# Patient Record
Sex: Female | Born: 1955 | Smoking: Former smoker
Health system: Southern US, Community
[De-identification: ages and names within clinical notes are randomized; demographics above are authoritative.]

## PROBLEM LIST (undated history)

## (undated) DIAGNOSIS — M549 Dorsalgia, unspecified: Secondary | ICD-10-CM

## (undated) DIAGNOSIS — M1990 Unspecified osteoarthritis, unspecified site: Secondary | ICD-10-CM

## (undated) DIAGNOSIS — M797 Fibromyalgia: Secondary | ICD-10-CM

## (undated) DIAGNOSIS — I10 Essential (primary) hypertension: Secondary | ICD-10-CM

## (undated) HISTORY — DX: Fibromyalgia: M79.7

## (undated) HISTORY — DX: Essential (primary) hypertension: I10

## (undated) HISTORY — PX: OTHER SURGICAL HISTORY: SHX169

## (undated) HISTORY — DX: Unspecified osteoarthritis, unspecified site: M19.90

---

## 2016-05-31 ENCOUNTER — Ambulatory Visit: Payer: Self-pay | Attending: Surgical | Admitting: Physical Therapy

## 2016-05-31 ENCOUNTER — Encounter: Payer: Self-pay | Admitting: Physical Therapy

## 2016-05-31 DIAGNOSIS — M6281 Muscle weakness (generalized): Secondary | ICD-10-CM | POA: Insufficient documentation

## 2016-05-31 DIAGNOSIS — G8929 Other chronic pain: Secondary | ICD-10-CM | POA: Insufficient documentation

## 2016-05-31 DIAGNOSIS — M542 Cervicalgia: Secondary | ICD-10-CM | POA: Insufficient documentation

## 2016-05-31 DIAGNOSIS — M545 Low back pain: Secondary | ICD-10-CM | POA: Insufficient documentation
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
## 2016-06-06 ENCOUNTER — Ambulatory Visit: Payer: Self-pay | Admitting: Physical Therapy

## 2016-06-06 ENCOUNTER — Encounter: Payer: Self-pay | Admitting: Physical Therapy

## 2016-06-06 DIAGNOSIS — M545 Low back pain: Secondary | ICD-10-CM

## 2016-06-06 DIAGNOSIS — M6281 Muscle weakness (generalized): Secondary | ICD-10-CM

## 2016-06-06 DIAGNOSIS — G8929 Other chronic pain: Secondary | ICD-10-CM

## 2016-06-06 DIAGNOSIS — I10 Essential (primary) hypertension: Secondary | ICD-10-CM | POA: Insufficient documentation

## 2016-06-06 DIAGNOSIS — M542 Cervicalgia: Secondary | ICD-10-CM

## 2016-06-06 NOTE — Therapy (Signed)
[facility] [address] [place], [place], [address] Phone: [phone]   Fax:  [phone]  Physical Therapy Treatment  Patient Details  Name: Kristine Scott MRN: [id number] Date of Birth: [birth year] Referring Provider: Doy MinceAnsari, Farrukh MD   Encounter Date: 06/06/2016      PT End of Session - 06/06/16 1325    Visit Number 2   Number of Visits 16   Date for PT Re-Evaluation 07/26/16   Authorization Type auto insurance    PT Start Time 0915   PT Stop Time 1009   PT Time Calculation (min) 54 min   Activity Tolerance Patient tolerated treatment well   Behavior During Therapy [facility] for tasks assessed/performed      Past Medical History:  Diagnosis Date  . Hypertension     Past Surgical History:  Procedure Laterality Date  . kidney      Stone removal    There were no vitals filed for this visit.      Subjective Assessment - 06/06/16 1318    Subjective Patient reports her left kene is very sore today. She can not rememebr doing anything to her knee. She still reports pain in her neck.    Patient is accompained by: Family member   Pertinent History Past history of neck and back pain    How long can you sit comfortably? sitting for a proloinged period of time    How long can you stand comfortably? < 10 minutes    How long can you walk comfortably? limited community distances with pain    Diagnostic tests MRI: (done in [place]) will have to fax    Patient Stated Goals To have less pain with daily activity    Currently in Pain? Yes   Pain Score 6    Pain Location Neck   Pain Orientation Right;Left   Pain Descriptors / Indicators Aching   Pain Radiating Towards less tingling into her arm    Pain Onset More than a month ago   Pain Frequency Constant   Aggravating Factors  use of her arm. Any prolonged psoitining    Pain Relieving Factors heat   Effect of Pain on Daily Activities difficulty perfroing ADL;s    Pain  Score 5   Pain Location Back   Pain Orientation Right;Left   Pain Descriptors / Indicators Aching   Pain Onset More than a month ago   Pain Frequency Constant   Aggravating Factors  doing anything physical    Pain Relieving Factors rest    Effect of Pain on Daily Activities difficulty perfroming ADL's IADL's                          [facility] Adult PT Treatment/Exercise - 06/06/16 0001      Neck Exercises: Seated   Cervical Rotation Limitations 2-3 times in pain free range      Lumbar Exercises: Stretches   Single Knee to Chest Stretch Limitations 3x30sec manual bilateral    Lower Trunk Rotation Limitations x10 in limited pain free range    Piriformis Stretch Limitations 3x30sec manual bilateral      Lumbar Exercises: Supine   Other Supine Lumbar Exercises balls squeeze with abdominal brace x10; hip abduction 2x10; decomoresion position x2 min; seated scap retraction x10      Modalities   Modalities Moist Heat     Moist Heat Therapy   Number Minutes Moist Heat 10 Minutes   Moist Heat  Location Cervical     Manual Therapy   Manual therapy comments manual cervical traction, manual cervical and upper trap trigger point release. Manual hip strethching                 PT Education - 06/06/16 1325    Education provided Yes   Education Details updated HEP, symptom management    Person(s) Educated Patient   Methods Explanation;Demonstration;Verbal cues;Tactile cues   Comprehension Verbalized understanding;Returned demonstration          PT Short Term Goals - 06/06/16 1336      PT SHORT TERM GOAL #1   Title Patient will increase bilateral cervcal rotation by 25 degrees    Time 4   Period Weeks   Status On-going     PT SHORT TERM GOAL #2   Title Patient will demostrate a good core contraction    Baseline moderate cuing required for core contraction    Time 4   Period Weeks   Status On-going     PT SHORT TERM GOAL #3   Title Patient will  increase billateral hip flexion mobility to 90 degrees bilateral    Time 4   Period Weeks   Status On-going     PT SHORT TERM GOAL #4   Title Patient will be independent with initial HEP    Time 4   Period Weeks   Status On-going           PT Long Term Goals - 05/31/16 1306      PT LONG TERM GOAL #1   Title Patient will demsotrate 4+/5 billateral lower extremity strength in order to improve her ability to stand for > 30 min    Time 8   Period Weeks   Status New     PT LONG TERM GOAL #2   Title Patient will demsotrate 65 degrees of cervical rotation bilateral in order to increase her safety driving.   Time 8   Period Weeks   Status New     PT LONG TERM GOAL #3   Title Patient will demsotrate 4+/5 billateral upper extremity strength in order to reach up into a cabinet    Time 8   Period Weeks   Status New     PT LONG TERM GOAL #4   Title Patient will report no pain while sitting in order to drive to appointments and shopping   Time 8   Period Weeks   Status New               Plan - 06/06/16 1334    Clinical Impression Statement Patient was able to tolerate exercises well. She continues to have increased spasming on the left upper trap. She was advised to be concious of her psotrue.    Rehab Potential Good   PT Frequency 2x / week   PT Duration 8 weeks   PT Treatment/Interventions ADLs/Self Care Home Management;Cryotherapy;Electrical Stimulation;Gait training;Ultrasound;Traction;Moist Heat;Iontophoresis 4mg /ml Dexamethasone;Functional mobility training;Therapeutic activities;Therapeutic exercise;Neuromuscular re-education;Manual techniques;Patient/family education;Passive range of motion;Taping;Dry needling   PT Next Visit Plan add light core strengthening, consider PPT, ball squeeze; seated ball roll, consider postrual correction, continue with postural education    PT Home Exercise Plan ltr, single knee to chest stretch, cervical rotation, tennis ball trigger  point release.    Consulted and Agree with Plan of Care Patient      Patient will benefit from skilled therapeutic intervention in order to improve the following deficits and impairments:  Abnormal gait, Pain,  Difficulty walking, Decreased strength, Decreased activity tolerance, Increased edema, Postural dysfunction, Impaired tone, Impaired sensation, Increased muscle spasms, Impaired UE functional use  Visit Diagnosis: Cervicalgia  Chronic bilateral low back pain without sciatica  Muscle weakness (generalized)     Problem List Patient Active Problem List   Diagnosis Date Noted  . HTN (hypertension) 06/06/2016    Dessie Coma PT DPT  06/06/2016, 1:38 PM  [facility] [address] [place], [place], [address] Phone: [phone]   Fax:  [phone]  Name: Kristine Scott MRN: [id number] Date of Birth: 07-13-1955

## 2016-06-11 ENCOUNTER — Ambulatory Visit: Payer: Self-pay | Admitting: Physical Therapy

## 2016-06-11 DIAGNOSIS — M542 Cervicalgia: Secondary | ICD-10-CM

## 2016-06-11 DIAGNOSIS — G8929 Other chronic pain: Secondary | ICD-10-CM

## 2016-06-11 DIAGNOSIS — M545 Low back pain: Secondary | ICD-10-CM

## 2016-06-11 DIAGNOSIS — M6281 Muscle weakness (generalized): Secondary | ICD-10-CM

## 2016-06-11 NOTE — Therapy (Signed)
[facility] [address] [place], [place], [address] Phone: [phone]   Fax:  [phone]  Physical Therapy Treatment  Patient Details  Name: Kristine ArbourClara Toelle MRN: [id number] Date of Birth: [birth year] Referring Provider: Doy MinceAnsari, Farrukh MD   Encounter Date: 06/11/2016      PT End of Session - 06/11/16 0951    Visit Number 3   Number of Visits 16   Date for PT Re-Evaluation 07/26/16   Authorization Type auto insurance    PT Start Time 0845   PT Stop Time 0923   PT Time Calculation (min) 38 min   Activity Tolerance Patient tolerated treatment well   Behavior [facility] Therapy [facility] for tasks assessed/performed      Past Medical History:  Diagnosis Date  . Hypertension     Past Surgical History:  Procedure Laterality Date  . kidney      Stone removal    There were no vitals filed for this visit.      Subjective Assessment - 06/11/16 0957    Subjective Patient reports her neck, back and knee are a alittle better. She is sore today she thinks from the rain but overall her pain is improving.    Patient is accompained by: Family member   Pertinent History Past history of neck and back pain    Limitations Walking   How long can you sit comfortably? sitting for a proloinged period of time    How long can you stand comfortably? < 10 minutes    How long can you walk comfortably? limited community distances with pain    Diagnostic tests MRI: (done in [place]) will have to fax    Patient Stated Goals To have less pain with daily activity    Currently in Pain? Yes   Pain Score 6    Pain Location Neck   Pain Orientation Right;Left   Pain Descriptors / Indicators Aching   Pain Radiating Towards less tingling into the arm    Pain Onset More than a month ago   Pain Frequency Constant   Aggravating Factors  use of her arm; prolinged positioning    Pain Relieving Factors eat    Effect of Pain on Daily Activities difficulty  performing ADL's    Multiple Pain Sites No   Pain Score 4   Pain Location Back   Pain Orientation Right;Left   Pain Descriptors / Indicators Aching   Pain Type Chronic pain   Pain Onset More than a month ago   Pain Frequency Constant   Aggravating Factors  walking    Pain Relieving Factors rest    Effect of Pain on Daily Activities difficulty perfroming ADL's/ IALD's                          [facility] Adult PT Treatment/Exercise - 06/11/16 0001      Neck Exercises: Seated   Cervical Rotation Limitations 2-3 times in pain free range      Lumbar Exercises: Stretches   Passive Hamstring Stretch Limitations seated 3x30sec holds. Given to work on on her drive    Single Knee to Chest Stretch Limitations 3x30sec manual bilateral    Lower Trunk Rotation Limitations x10 in limited pain free range    Piriformis Stretch Limitations 3x20sec seated. Some dififculty getting her leg up but it felt like a goood stretch.      Lumbar Exercises: Supine   Other Supine Lumbar Exercises supine shoulder flexion  with cane 2x10; ball squeeze with abdominal tightening 2x10 Hip abduction with yellow band 2x10      Manual Therapy   Manual therapy comments manual cervical traction, manual cervical and upper trap trigger point release. Manual hip strethching                   PT Short Term Goals - 06/11/16 1012      PT SHORT TERM GOAL #1   Title Patient will increase bilateral cervcal rotation by 25 degrees    Baseline full motion noted today    Time 4   Period Weeks   Status Achieved     PT SHORT TERM GOAL #2   Title Patient will demostrate a good core contraction    Baseline continues to require cuing    Time 4   Period Weeks   Status On-going     PT SHORT TERM GOAL #3   Title Patient will increase billateral hip flexion mobility to 90 degrees bilateral    Baseline tightness on the left but improved with stretching    Time 4   Period Weeks   Status On-going     PT  SHORT TERM GOAL #4   Title Patient will be independent with initial HEP    Time 4   Period Weeks   Status On-going           PT Long Term Goals - 05/31/16 1306      PT LONG TERM GOAL #1   Title Patient will demsotrate 4+/5 billateral lower extremity strength in order to improve her ability to stand for > 30 min    Time 8   Period Weeks   Status New     PT LONG TERM GOAL #2   Title Patient will demsotrate 65 degrees of cervical rotation bilateral in order to increase her safety driving.   Time 8   Period Weeks   Status New     PT LONG TERM GOAL #3   Title Patient will demsotrate 4+/5 billateral upper extremity strength in order to reach up into a cabinet    Time 8   Period Weeks   Status New     PT LONG TERM GOAL #4   Title Patient will report no pain while sitting in order to drive to appointments and shopping   Time 8   Period Weeks   Status New               Plan - 06/11/16 1010    Clinical Impression Statement Patient demsotrated full cervical rotation with slight pain at end range. She will be driving to [place] after the treatment. She was given stretches to work on on her drive. She will not return for 2 weeks. She was adviused to continue with her exercises.     Rehab Potential Good   PT Frequency 2x / week   PT Duration 8 weeks   PT Treatment/Interventions ADLs/Self Care Home Management;Cryotherapy;Electrical Stimulation;Gait training;Ultrasound;Traction;Moist Heat;Iontophoresis 4mg /ml Dexamethasone;Functional mobility training;Therapeutic activities;Therapeutic exercise;Neuromuscular re-education;Manual techniques;Patient/family education;Passive range of motion;Taping;Dry needling   PT Next Visit Plan add light core strengthening, consider PPT, ball squeeze; seated ball roll, consider postrual correction, continue with postural education    PT Home Exercise Plan ltr, single knee to chest stretch, cervical rotation, tennis ball trigger point release.     Consulted and Agree with Plan of Care Patient      Patient will benefit from skilled therapeutic intervention in order to improve the  following deficits and impairments:  Abnormal gait, Pain, Difficulty walking, Decreased strength, Decreased activity tolerance, Increased edema, Postural dysfunction, Impaired tone, Impaired sensation, Increased muscle spasms, Impaired UE functional use  Visit Diagnosis: Cervicalgia  Chronic bilateral low back pain without sciatica  Muscle weakness (generalized)     Problem List Patient Active Problem List   Diagnosis Date Noted  . HTN (hypertension) 06/06/2016    Dessie Coma 06/11/2016, 8:43 PM  [facility] [address] [place], [place], [address] Phone: [phone]   Fax:  [phone]  Name: Nahima Ales MRN: [id number] Date of Birth: December 19, 1955

## 2016-06-13 ENCOUNTER — Ambulatory Visit: Payer: Self-pay | Admitting: Physical Therapy

## 2016-06-18 ENCOUNTER — Ambulatory Visit: Payer: Self-pay | Admitting: Physical Therapy

## 2016-06-20 ENCOUNTER — Ambulatory Visit: Payer: Self-pay | Admitting: Physical Therapy

## 2016-06-25 ENCOUNTER — Ambulatory Visit: Payer: Self-pay | Attending: Anesthesiology | Admitting: Physical Therapy

## 2016-06-25 ENCOUNTER — Encounter: Payer: Self-pay | Admitting: Physical Therapy

## 2016-06-25 DIAGNOSIS — M545 Low back pain, unspecified: Secondary | ICD-10-CM

## 2016-06-25 DIAGNOSIS — M542 Cervicalgia: Secondary | ICD-10-CM | POA: Insufficient documentation

## 2016-06-25 DIAGNOSIS — M6281 Muscle weakness (generalized): Secondary | ICD-10-CM | POA: Insufficient documentation

## 2016-06-25 DIAGNOSIS — G8929 Other chronic pain: Secondary | ICD-10-CM | POA: Insufficient documentation

## 2016-06-25 NOTE — Therapy (Signed)
[facility] [address] [place], [place], [address] Phone: [phone]   Fax:  [phone]  Physical Therapy Treatment  Patient Details  Name: Kristine ArbourClara Lins MRN: [id number] Date of Birth: 1956-02-18 Referring Provider: Doy MinceAnsari, Farrukh MD   Encounter Date: 06/25/2016      PT End of Session - 06/25/16 0919    Visit Number 4   Number of Visits 16   Date for PT Re-Evaluation 07/26/16   Authorization Type auto insurance    PT Start Time [phone]   PT Stop Time 0923   PT Time Calculation (min) 39 min   Activity Tolerance Patient tolerated treatment well   Behavior During Therapy [facility] for tasks assessed/performed      Past Medical History:  Diagnosis Date  . Hypertension     Past Surgical History:  Procedure Laterality Date  . kidney      Stone removal    There were no vitals filed for this visit.      Subjective Assessment - 06/25/16 0849    Subjective Patient has been [place] in [place]. She drove to [place], and back. She is having pain in her hands, neck, and back. Her pain is worse since driving.   Patient is accompained by: Family member   Pertinent History Past history of neck and back pain    Limitations Walking   How long can you sit comfortably? sitting for a proloinged period of time    How long can you stand comfortably? < 10 minutes    How long can you walk comfortably? limited community distances with pain    Diagnostic tests MRI: (done in [place]) will have to fax    Patient Stated Goals To have less pain with daily activity    Currently in Pain? Yes   Pain Score 9    Pain Location Neck   Pain Orientation Right;Left   Pain Descriptors / Indicators Aching   Pain Onset More than a month ago   Pain Frequency Constant   Aggravating Factors  use of the arm    Pain Relieving Factors rest    Effect of Pain on Daily Activities difficulty perfroming ADL's    Multiple Pain Sites Yes   Pain Score 9   Pain  Location Back   Pain Orientation Right;Left   Pain Descriptors / Indicators Aching   Pain Type Chronic pain   Pain Onset More than a month ago   Pain Frequency Constant   Aggravating Factors  walking   Pain Relieving Factors rest    Effect of Pain on Daily Activities difficulty performing ADL's                          [facility] Adult PT Treatment/Exercise - 06/25/16 0001      Neck Exercises: Seated   Cervical Rotation Limitations 2-3 times in pain free range    Other Seated Exercise scapular retraction 2x10; Patient was going straight up. Patient advised not to do that.      Lumbar Exercises: Stretches   Passive Hamstring Stretch Limitations seated 3x30sec holds. Maunal 2nd to pain in her hands     Single Knee to Chest Stretch Limitations 3x30sec manual bilateral    Lower Trunk Rotation Limitations x10 in limited pain free range    Piriformis Stretch Limitations 3x20sec seated. Some dififculty getting her leg up but it felt like a goood stretch. Manual 2nd to pin in her hands.  Lumbar Exercises: Supine   Other Supine Lumbar Exercises balls squeeze with abdominal brace x10; hip abduction 2x10; seated scap retraction x10      Manual Therapy   Manual therapy comments manual cervical traction, manual cervical and upper trap trigger point release. Manual hip strethching                 PT Education - 06/25/16 1610    Education provided Yes   Education Details inmprotance of continued exercises. Reiviewed HEP    Person(s) Educated Patient   Methods Explanation;Demonstration;Verbal cues;Tactile cues   Comprehension Verbalized understanding;Returned demonstration          PT Short Term Goals - 06/11/16 1012      PT SHORT TERM GOAL #1   Title Patient will increase bilateral cervcal rotation by 25 degrees    Baseline full motion noted today    Time 4   Period Weeks   Status Achieved     PT SHORT TERM GOAL #2   Title Patient will demostrate a good  core contraction    Baseline continues to require cuing    Time 4   Period Weeks   Status On-going     PT SHORT TERM GOAL #3   Title Patient will increase billateral hip flexion mobility to 90 degrees bilateral    Baseline tightness on the left but improved with stretching    Time 4   Period Weeks   Status On-going     PT SHORT TERM GOAL #4   Title Patient will be independent with initial HEP    Time 4   Period Weeks   Status On-going           PT Long Term Goals - 05/31/16 1306      PT LONG TERM GOAL #1   Title Patient will demsotrate 4+/5 billateral lower extremity strength in order to improve her ability to stand for > 30 min    Time 8   Period Weeks   Status New     PT LONG TERM GOAL #2   Title Patient will demsotrate 65 degrees of cervical rotation bilateral in order to increase her safety driving.   Time 8   Period Weeks   Status New     PT LONG TERM GOAL #3   Title Patient will demsotrate 4+/5 billateral upper extremity strength in order to reach up into a cabinet    Time 8   Period Weeks   Status New     PT LONG TERM GOAL #4   Title Patient will report no pain while sitting in order to drive to appointments and shopping   Time 8   Period Weeks   Status New               Plan - 06/25/16 0920    Clinical Impression Statement Patient had difficulty doing stretches 2nd to habd pain. She reported incfreased pain with basic exercises. She was encouraged to do the exercises. She was only given 2 to do and they were very low level. She was encouraged to perfrom her HEP. She appears to have lost all the gains she has made over the last 2 weeks. She had increased pain on the left with palpation of the cervical spine.    Rehab Potential Good   PT Frequency 2x / week   PT Duration 8 weeks   PT Treatment/Interventions ADLs/Self Care Home Management;Cryotherapy;Electrical Stimulation;Gait training;Ultrasound;Traction;Moist Heat;Iontophoresis 4mg /ml  Dexamethasone;Functional mobility training;Therapeutic activities;Therapeutic exercise;Neuromuscular re-education;Manual  techniques;Patient/family education;Passive range of motion;Taping;Dry needling   PT Next Visit Plan add light core strengthening, consider PPT, ball squeeze; seated ball roll, consider postrual correction, continue with postural education    PT Home Exercise Plan ltr, single knee to chest stretch, cervical rotation, tennis ball trigger point release.    Consulted and Agree with Plan of Care Patient      Patient will benefit from skilled therapeutic intervention in order to improve the following deficits and impairments:  Abnormal gait, Pain, Difficulty walking, Decreased strength, Decreased activity tolerance, Increased edema, Postural dysfunction, Impaired tone, Impaired sensation, Increased muscle spasms, Impaired UE functional use  Visit Diagnosis: Cervicalgia  Chronic bilateral low back pain without sciatica  Muscle weakness (generalized)     Problem List Patient Active Problem List   Diagnosis Date Noted  . HTN (hypertension) 06/06/2016    Dessie Coma PT DPT  06/25/2016, 2:03 PM  [facility] [address] [place], [place], [address] Phone: [phone]   Fax:  [phone]  Name: Aavya Shafer MRN: [id number] Date of Birth: September 27, 1955

## 2016-06-27 ENCOUNTER — Encounter: Payer: Self-pay | Admitting: Physical Therapy

## 2016-06-27 ENCOUNTER — Ambulatory Visit: Payer: Self-pay | Admitting: Physical Therapy

## 2016-06-27 DIAGNOSIS — M545 Low back pain, unspecified: Secondary | ICD-10-CM

## 2016-06-27 DIAGNOSIS — M6281 Muscle weakness (generalized): Secondary | ICD-10-CM

## 2016-06-27 DIAGNOSIS — M542 Cervicalgia: Secondary | ICD-10-CM

## 2016-06-27 DIAGNOSIS — G8929 Other chronic pain: Secondary | ICD-10-CM

## 2016-06-27 NOTE — Therapy (Signed)
[facility] [address] [place], [place], [address] Phone: [phone]   Fax:  [phone]  Physical Therapy Treatment  Patient Details  Name: Kristine Scott MRN: [id number] Date of Birth: 13-May-1955 Referring Provider: Doy Mince MD   Encounter Date: 06/27/2016      PT End of Session - 06/27/16 0856    Visit Number 5   Number of Visits 16   Date for PT Re-Evaluation 07/26/16   Authorization Type auto insurance    PT Start Time 0845   PT Stop Time 0926   PT Time Calculation (min) 41 min   Activity Tolerance Patient tolerated treatment well   Behavior During Therapy [facility] for tasks assessed/performed      Past Medical History:  Diagnosis Date  . Hypertension     Past Surgical History:  Procedure Laterality Date  . kidney      Stone removal    There were no vitals filed for this visit.      Subjective Assessment - 06/27/16 0850    Subjective Patient reports that she feels like her pain may be a little better. She continues to report high level pain in her neck and back.    Patient is accompained by: Family member   Pertinent History Past history of neck and back pain    Limitations Walking   How long can you sit comfortably? sitting for a proloinged period of time    How long can you stand comfortably? < 10 minutes    How long can you walk comfortably? limited community distances with pain    Diagnostic tests MRI: (done in [place]) will have to fax    Patient Stated Goals To have less pain with daily activity    Currently in Pain? Yes   Pain Score 9    Pain Location Neck   Pain Orientation Right;Left   Pain Descriptors / Indicators Aching   Pain Radiating Towards tingling into both arms    Pain Onset More than a month ago   Pain Frequency Constant   Aggravating Factors  use of the arm    Pain Relieving Factors rest    Effect of Pain on Daily Activities diffciulty perfroming ADL's    Multiple Pain  Sites Yes   Pain Score 9   Pain Location Back   Pain Orientation Right;Left   Pain Descriptors / Indicators Aching   Pain Type Chronic pain   Pain Onset More than a month ago   Pain Frequency Constant   Aggravating Factors  walking    Pain Relieving Factors rest    Effect of Pain on Daily Activities difficulty perfroming AD's                          [facility] Adult PT Treatment/Exercise - 06/27/16 0001      Neck Exercises: Seated   Cervical Rotation Limitations 2-3 times in pain free range    Other Seated Exercise scapular retraction 2x10; Patient was going straight up. Patient advised not to do that.      Lumbar Exercises: Stretches   Passive Hamstring Stretch Limitations seated 3x30sec holds. Maunal 2nd to pain in her hands     Single Knee to Chest Stretch Limitations 3x30sec manual bilateral    Lower Trunk Rotation Limitations x10 in limited pain free range    Piriformis Stretch Limitations 3x20sec seated. Some dififculty getting her leg up but it felt like a goood stretch. Manual  2nd to pin in her hands.     Lumbar Exercises: Supine   Other Supine Lumbar Exercises balls squeeze with abdominal brace x10; hip abduction 2x10; seated scap retraction x10      Manual Therapy   Manual therapy comments manual cervical traction, manual cervical and upper trap trigger point release. Manual hip strethching                 PT Education - 06/27/16 0855    Education provided Yes   Education Details rationale behind exercises.    Person(s) Educated Patient   Methods Explanation;Demonstration;Tactile cues;Verbal cues   Comprehension Verbalized understanding;Returned demonstration          PT Short Term Goals - 06/27/16 1311      PT SHORT TERM GOAL #1   Title Patient will increase bilateral cervcal rotation by 25 degrees    Baseline full motion noted today    Time 4   Period Weeks   Status Achieved     PT SHORT TERM GOAL #2   Title Patient will  demostrate a good core contraction    Baseline continues to require cuing    Time 4   Period Weeks   Status On-going     PT SHORT TERM GOAL #3   Title Patient will increase billateral hip flexion mobility to 90 degrees bilateral    Baseline tightness on the left but improved with stretching    Time 4   Period Weeks   Status On-going     PT SHORT TERM GOAL #4   Title Patient will be independent with initial HEP    Time 4   Period Weeks   Status On-going           PT Long Term Goals - 05/31/16 1306      PT LONG TERM GOAL #1   Title Patient will demsotrate 4+/5 billateral lower extremity strength in order to improve her ability to stand for > 30 min    Time 8   Period Weeks   Status New     PT LONG TERM GOAL #2   Title Patient will demsotrate 65 degrees of cervical rotation bilateral in order to increase her safety driving.   Time 8   Period Weeks   Status New     PT LONG TERM GOAL #3   Title Patient will demsotrate 4+/5 billateral upper extremity strength in order to reach up into a cabinet    Time 8   Period Weeks   Status New     PT LONG TERM GOAL #4   Title Patient will report no pain while sitting in order to drive to appointments and shopping   Time 8   Period Weeks   Status New               Plan - 06/27/16 1310    Clinical Impression Statement Patient continues to have spamsing on the left side. She reports some pain with exercises at home but but she was advised she needs to continue with them. She is onyl doing basic movements and stretches. If she can not tolerate them she may have to go back to the MD,    Rehab Potential Good   PT Frequency 2x / week   PT Duration 8 weeks   PT Treatment/Interventions ADLs/Self Care Home Management;Cryotherapy;Electrical Stimulation;Gait training;Ultrasound;Traction;Moist Heat;Iontophoresis 4mg /ml Dexamethasone;Functional mobility training;Therapeutic activities;Therapeutic exercise;Neuromuscular  re-education;Manual techniques;Patient/family education;Passive range of motion;Taping;Dry needling   PT Next Visit Plan add light  core strengthening, consider PPT, ball squeeze; seated ball roll, consider postrual correction, continue with postural education    PT Home Exercise Plan ltr, single knee to chest stretch, cervical rotation, tennis ball trigger point release.    Consulted and Agree with Plan of Care Patient      Patient will benefit from skilled therapeutic intervention in order to improve the following deficits and impairments:  Abnormal gait, Pain, Difficulty walking, Decreased strength, Decreased activity tolerance, Increased edema, Postural dysfunction, Impaired tone, Impaired sensation, Increased muscle spasms, Impaired UE functional use  Visit Diagnosis: Cervicalgia  Chronic bilateral low back pain without sciatica  Muscle weakness (generalized)     Problem List Patient Active Problem List   Diagnosis Date Noted  . HTN (hypertension) 06/06/2016    Dessie Coma PT DPT  06/27/2016, 1:17 PM  [facility] [address] [place], [place], [address] Phone: [phone]   Fax:  [phone]  Name: Kristine Scott MRN: [id number] Date of Birth: [birth year]

## 2016-07-02 ENCOUNTER — Ambulatory Visit: Payer: Self-pay | Admitting: Physical Therapy

## 2016-07-02 ENCOUNTER — Encounter: Payer: Self-pay | Admitting: Physical Therapy

## 2016-07-02 DIAGNOSIS — M545 Low back pain, unspecified: Secondary | ICD-10-CM

## 2016-07-02 DIAGNOSIS — M542 Cervicalgia: Secondary | ICD-10-CM

## 2016-07-02 DIAGNOSIS — G8929 Other chronic pain: Secondary | ICD-10-CM

## 2016-07-02 DIAGNOSIS — M6281 Muscle weakness (generalized): Secondary | ICD-10-CM

## 2016-07-02 NOTE — Therapy (Signed)
[place], [place], [address] Phone: [phone]   Fax:  [phone]  Physical Therapy Treatment  Patient Details  Name: Kristine Scott MRN: [id number] Date of Birth: 1956/02/14 Referring Provider: Alvino Chapel MD   Encounter Date: 07/02/2016      PT End of Session - 07/02/16 1038    Visit Number 6   Number of Visits 16   Date for PT Re-Evaluation 07/26/16   Authorization Type auto insurance    PT Start Time [phone]   PT Stop Time 1018   PT Time Calculation (min) 42 min   Activity Tolerance Patient tolerated treatment well   Behavior During Therapy [facility] for tasks assessed/performed      Past Medical History:  Diagnosis Date  . Hypertension     Past Surgical History:  Procedure Laterality Date  . kidney      Stone removal    There were no vitals filed for this visit.      Subjective Assessment - 07/02/16 0944    Subjective Patient reports this woil;l be her last day. She does not think insurance will cover anymore visits. She reports her pain is a little better but she continues to have pain in the neck. She is still not doing her exercises.    Patient is accompained by: Family member   Pertinent History Past history of neck and back pain    Limitations Walking   How long can you sit comfortably? sitting for a proloinged period of time    How long can you stand comfortably? < 10 minutes    How long can you walk comfortably? limited community distances with pain    Diagnostic tests MRI: (done in [place]) will have to fax    Patient Stated Goals To have less pain with daily activity    Currently in Pain? Yes   Pain Score 9    Pain Location Neck   Pain Orientation Right;Left   Pain Descriptors / Indicators Aching   Pain Frequency Constant   Aggravating Factors  ues of the arm    Pain Relieving Factors rest    Effect of Pain on Daily Activities difficulty performing ADL's    Multiple  Pain Sites Yes   Pain Score 6   Pain Location Back   Pain Orientation Right;Left   Pain Descriptors / Indicators Aching   Pain Type Chronic pain   Pain Onset More than a month ago   Pain Frequency Constant   Aggravating Factors  walking    Pain Relieving Factors rest   Effect of Pain on Daily Activities difficulty perfroming ADL's                         [facility] Adult PT Treatment/Exercise - 07/02/16 0001      Neck Exercises: Seated   Cervical Rotation Limitations 2-3 times in pain free range    Other Seated Exercise scapular retraction 2x10; Patient was going straight up. Patient advised not to do that.      Lumbar Exercises: Stretches   Passive Hamstring Stretch Limitations seated 3x30sec holds. Maunal 2nd to pain in her hands     Single Knee to Chest Stretch Limitations 3x30sec manual bilateral    Lower Trunk Rotation Limitations x10 in limited pain free range    Piriformis Stretch Limitations 3x20sec seated. Some dififculty getting her leg up but it felt like a goood stretch. Manual 2nd to pin in her  hands.     Lumbar Exercises: Supine   Other Supine Lumbar Exercises balls squeeze with abdominal brace x10; hip abduction 2x10; seated scap retraction x10      Manual Therapy   Manual therapy comments manual cervical traction, manual cervical and upper trap trigger point release. Manual hip strethching                 PT Education - 07/02/16 1036    Education provided Yes   Education Details reviewed HEP and the improtance of beginingto perfrom activity    Person(s) Educated Patient   Methods Explanation;Demonstration;Tactile cues;Verbal cues   Comprehension Verbalized understanding;Returned demonstration          PT Short Term Goals - 07/02/16 1046      PT SHORT TERM GOAL #1   Title Patient will increase bilateral cervcal rotation by 25 degrees    Baseline full motion noted today    Time 4   Period Weeks   Status Achieved     PT SHORT TERM  GOAL #2   Title Patient will demostrate a good core contraction    Baseline continues to require cuing    Time 4   Period Weeks   Status On-going     PT SHORT TERM GOAL #3   Title Patient will increase billateral hip flexion mobility to 90 degrees bilateral    Baseline tightness on the left but improved with stretching    Time 4   Period Weeks   Status On-going     PT SHORT TERM GOAL #4   Title Patient will be independent with initial HEP    Time 4   Period Weeks   Status On-going           PT Long Term Goals - 05/31/16 1306      PT LONG TERM GOAL #1   Title Patient will demsotrate 4+/5 billateral lower extremity strength in order to improve her ability to stand for > 30 min    Time 8   Period Weeks   Status New     PT LONG TERM GOAL #2   Title Patient will demsotrate 65 degrees of cervical rotation bilateral in order to increase her safety driving.   Time 8   Period Weeks   Status New     PT LONG TERM GOAL #3   Title Patient will demsotrate 4+/5 billateral upper extremity strength in order to reach up into a cabinet    Time 8   Period Weeks   Status New     PT LONG TERM GOAL #4   Title Patient will report no pain while sitting in order to drive to appointments and shopping   Time 8   Period Weeks   Status New               Plan - 07/02/16 1045    Clinical Impression Statement Therapy reviewed HEP with the patient. She will no longer bew able to come 2nd to lack of insurance. She showed an imporvement on FOTO but she has not yet reached her goals. She continues to reports she has not been doing her exercises because she has been in the process of moving. She was advised she needs to do them if she hopes to get better.    Rehab Potential Good   PT Frequency 2x / week   PT Duration 8 weeks   PT Treatment/Interventions ADLs/Self Care Home Management;Cryotherapy;Electrical Stimulation;Gait training;Ultrasound;Traction;Moist Heat;Iontophoresis 73m/ml  Dexamethasone;Functional mobility training;Therapeutic  activities;Therapeutic exercise;Neuromuscular re-education;Manual techniques;Patient/family education;Passive range of motion;Taping;Dry needling   PT Next Visit Plan add light core strengthening, consider PPT, ball squeeze; seated ball roll, consider postrual correction, continue with postural education    PT Home Exercise Plan ltr, single knee to chest stretch, cervical rotation, tennis ball trigger point release.    Consulted and Agree with Plan of Care Patient      Patient will benefit from skilled therapeutic intervention in order to improve the following deficits and impairments:  Abnormal gait, Pain, Difficulty walking, Decreased strength, Decreased activity tolerance, Increased edema, Postural dysfunction, Impaired tone, Impaired sensation, Increased muscle spasms, Impaired UE functional use  Visit Diagnosis: Cervicalgia  Chronic bilateral low back pain without sciatica  Muscle weakness (generalized)    PHYSICAL THERAPY DISCHARGE SUMMARY  Visits from Start of Care: 6  Current functional level related to goals / functional outcomes: Continues to have pain   Remaining deficits: Continues to have neck and back pain that   Education / Equipment: HEP Plan: Patient agrees to discharge.  Patient goals were not met. Patient is being discharged due to financial reasons.  ?????     Problem List Patient Active Problem List   Diagnosis Date Noted  . HTN (hypertension) 06/06/2016    Carney Living  PT DPT  07/02/2016, 11:32 AM  [facility] [address] [place], [place], [address] Phone: [phone]   Fax:  [phone]  Name: Heavyn Yearsley MRN: [id number] Date of Birth: 12-02-1955

## 2016-07-04 ENCOUNTER — Ambulatory Visit: Payer: Self-pay | Admitting: Physical Therapy

## 2016-07-09 ENCOUNTER — Ambulatory Visit: Payer: Self-pay | Admitting: Physical Therapy

## 2016-12-07 ENCOUNTER — Encounter (INDEPENDENT_AMBULATORY_CARE_PROVIDER_SITE_OTHER): Payer: Self-pay

## 2016-12-07 ENCOUNTER — Ambulatory Visit (INDEPENDENT_AMBULATORY_CARE_PROVIDER_SITE_OTHER): Payer: 59 | Admitting: Neurology

## 2016-12-07 ENCOUNTER — Encounter: Payer: Self-pay | Admitting: Neurology

## 2016-12-07 VITALS — BP 112/71 | HR 80 | Ht 63.0 in | Wt 203.5 lb

## 2016-12-07 DIAGNOSIS — R202 Paresthesia of skin: Secondary | ICD-10-CM | POA: Diagnosis not present

## 2016-12-07 DIAGNOSIS — M79602 Pain in left arm: Secondary | ICD-10-CM | POA: Diagnosis not present

## 2016-12-07 DIAGNOSIS — M797 Fibromyalgia: Secondary | ICD-10-CM | POA: Diagnosis not present

## 2016-12-07 DIAGNOSIS — M79601 Pain in right arm: Secondary | ICD-10-CM | POA: Diagnosis not present

## 2016-12-07 HISTORY — DX: Fibromyalgia: M79.7

## 2016-12-07 NOTE — Patient Instructions (Signed)
   We will check EMG and NCV of the arms to evaluate the nerve function.

## 2016-12-07 NOTE — Progress Notes (Signed)
Reason for visit: Bilateral arm discomfort and paresthesias  Referring physician: Dr. Cristela Felt is a 61 y.o. female  History of present illness:  Ms. Gingrich is a 61 year old right-handed Hispanic female with a history of bilateral arm numbness and tingling discomfort that has been going on for about 3 or 4 months, gradually worsening with symptoms. The patient reports that she does have chronic issues with neck pain and neck stiffness and she has been told that she has spinal stenosis and arthritis in her neck. The patient also has fibromyalgia. She reports no weakness of the arms, but she is having difficulty sleeping at night because of the discomfort. She will also note onset of symptoms while she is using the computer. She denies any symptoms of numbness or pain that goes down the arms with looking up or looking down or turning her head from one side to the next. The patient denies any balance issues or difficulty controlling the bowels or the bladder. She denies any similar numbness or pain in the feet or legs. The patient may drop things on occasion. She has pain particularly in the thumbs on both sides. She is sent to this office for evaluation.  Past Medical History:  Diagnosis Date  . Hypertension     Past Surgical History:  Procedure Laterality Date  . kidney      Stone removal    Family History  Problem Relation Age of Onset  . Heart disease Father     Social history:  reports that she quit smoking about 7 months ago. She smoked 0.25 packs per day. She has never used smokeless tobacco. She reports that she does not drink alcohol or use drugs.  Medications:  Prior to Admission medications   Medication Sig Start Date End Date Taking? Authorizing Provider  amlodipine-benazepril (LOTREL) 2.5-10 MG capsule Take 1 capsule by mouth daily.   Yes [provider]  HYDROcodone-ibuprofen (VICOPROFEN) 7.5-200 MG tablet Take 1 tablet by mouth every 6  (six) hours as needed for moderate pain.   Yes [provider]  lisinopril-hydrochlorothiazide (PRINZIDE,ZESTORETIC) 20-25 MG tablet Take 1 tablet by mouth daily.   Yes [provider]     No Known Allergies  ROS:  Out of a complete 14 system review of symptoms, the patient complains only of the following symptoms, and all other reviewed systems are negative.  Joint pain, achy muscles  Blood pressure 112/71, pulse 80, height 5\' 3"  (1.6 m), weight 203 lb 8 oz (92.3 kg).  Physical Exam  General: The patient is alert and cooperative at the time of the examination. The patient is moderately obese.  Eyes: Pupils are equal, round, and reactive to light. Discs are flat bilaterally.  Neck: The neck is supple, no carotid bruits are noted.  Respiratory: The respiratory examination is clear.  Cardiovascular: The cardiovascular examination reveals a regular rate and rhythm, no obvious murmurs or rubs are noted.  Neuromuscular: The patient lacks approximately 30 of full lateral rotation of the cervical spine bilaterally.  Skin: Extremities are without significant edema.  Neurologic Exam  Mental status: The patient is alert and oriented x 3 at the time of the examination. The patient has apparent normal recent and remote memory, with an apparently normal attention span and concentration ability.  Cranial nerves: Facial symmetry is present. There is good sensation of the face to pinprick and soft touch bilaterally. The strength of the facial muscles and the muscles to head turning and shoulder  shrug are normal bilaterally. Speech is well enunciated, no aphasia or dysarthria is noted. Extraocular movements are full. Visual fields are full. The tongue is midline, and the patient has symmetric elevation of the soft palate. No obvious hearing deficits are noted.  Motor: The motor testing reveals 5 over 5 strength of all 4 extremities. Good symmetric motor tone is noted  throughout.  Sensory: Sensory testing is intact to pinprick, soft touch, vibration sensation, and position sense on all 4 extremities. No evidence of extinction is noted.  Coordination: Cerebellar testing reveals good finger-nose-finger and heel-to-shin bilaterally. Tinel's sign at the wrist is positive on the right, negative on the left.  Gait and station: Gait is normal. Tandem gait is normal. Romberg is negative. No drift is seen.  Reflexes: Deep tendon reflexes are symmetric and normal bilaterally. Toes are downgoing bilaterally.   Assessment/Plan:  1. Bilateral arm paresthesias and discomfort  2. Fibromyalgia  3. Cervical spondylosis  The patient may have bilateral carpal tunnel syndrome as a cause of her current symptoms. The patient does have chronic neck pain, cervical radiculopathy does need to be excluded. The patient be set up for nerve conduction studies on both arms, EMG on at least one arm. Depending upon the results of the above study, we may pursue further testing. She will return for the EMG evaluation.  Marlan Palau MD 12/07/2016 8:28 AM  [facility] [address] [place], [place] [address]  Phone [phone] Fax [phone]

## 2017-01-07 ENCOUNTER — Ambulatory Visit (INDEPENDENT_AMBULATORY_CARE_PROVIDER_SITE_OTHER): Payer: Self-pay | Admitting: Neurology

## 2017-01-07 ENCOUNTER — Encounter: Payer: Self-pay | Admitting: Neurology

## 2017-01-07 ENCOUNTER — Ambulatory Visit (INDEPENDENT_AMBULATORY_CARE_PROVIDER_SITE_OTHER): Payer: Medicare Other | Admitting: Neurology

## 2017-01-07 DIAGNOSIS — M79601 Pain in right arm: Secondary | ICD-10-CM

## 2017-01-07 DIAGNOSIS — M79602 Pain in left arm: Secondary | ICD-10-CM

## 2017-01-07 DIAGNOSIS — R202 Paresthesia of skin: Secondary | ICD-10-CM

## 2017-01-07 NOTE — Procedures (Signed)
     HISTORY:  Kristine Scott is a 61 year old patient with a history of fibromyalgia who reports neck pain, shoulder pain, and paresthesias and discomfort down both arms. The patient is being evaluated for a possible neuropathy or a cervical radiculopathy.  NERVE CONDUCTION STUDIES:  Nerve conduction studies were performed on the left upper extremity. The distal motor latencies and motor amplitudes for the median and ulnar nerves were within normal limits. The F wave latencies and nerve conduction velocities for these nerves were also normal. The sensory latencies for the median and ulnar nerves were normal.   EMG STUDIES:  EMG study was performed on the left upper extremity:  The first dorsal interosseous muscle reveals 2 to 4 K units with full recruitment. No fibrillations or positive waves were noted. The abductor pollicis brevis muscle reveals 2 to 4 K units with full recruitment. No fibrillations or positive waves were noted.  The patient refused further EMG evaluation.  IMPRESSION:  Nerve conduction studies done on both upper extremities were unremarkable. No evidence of a neuropathy is seen. EMG evaluation of the left upper extremity was attempted, the patient refused further evaluation after testing two muscles. The diagnosis of an overlying cervical radiculopathy cannot be ascertained from the study.  Marlan Palau MD 01/07/2017 9:13 AM  [facility] [address] [place], [place] [address]  Phone [phone] Fax [phone]

## 2017-01-07 NOTE — Progress Notes (Signed)
Please refer to EMG and nerve conduction study procedure note. 

## 2017-01-07 NOTE — Progress Notes (Addendum)
The patient comes in today for EMG and nerve conduction study evaluation. Nerve conduction studies on both arms were unremarkable, the patient had a limited EMG of the left arm, she refused full study.  She continues to have ongoing neck pain, shoulder pain, and pain and discomfort and paresthesias down both arms. She will have MRI of the cervical spine.    MNC    Nerve / Sites Muscle Latency Ref. Amplitude Ref. Rel Amp Segments Distance Velocity Ref. Area    ms ms mV mV %  cm m/s m/s mVms  L Median - APB     Wrist APB 3.7 ?4.4 12.4 ?4.0 100 Wrist - APB 7   38.4     Upper arm APB 7.3  11.9  96 Upper arm - Wrist 23 63 ?49 35.1         Axilla - Wrist      R Median - APB     Wrist APB 3.4 ?4.4 11.9 ?4.0 100 Wrist - APB 7   31.4     Upper arm APB 7.1  9.2  77.4 Upper arm - Wrist 23 61 ?49 25.4  L Ulnar - ADM     Wrist ADM 2.3 ?3.3 9.6 ?6.0 100 Wrist - ADM 7   24.5     B.Elbow ADM 6.0  9.1  95.1 B.Elbow - Wrist 21 56 ?49 23.1     A.Elbow ADM 7.7  8.2  90.6 A.Elbow - B.Elbow 10 62 ?49 20.6         A.Elbow - Wrist      R Ulnar - ADM     Wrist ADM 2.4 ?3.3 8.7 ?6.0 100 Wrist - ADM 7   22.6     B.Elbow ADM 6.1  8.4  96.7 B.Elbow - Wrist 21 58 ?49 22.9     A.Elbow ADM 7.7  7.8  92.7 A.Elbow - B.Elbow 10 64 ?49 22.8         A.Elbow - Wrist                 SNC    Nerve / Sites Rec. Site Peak Lat Amp Segments Distance    ms V  cm  L Median - Orthodromic (Dig II, Mid palm)     Dig II Wrist 3.6 33 Dig II - Wrist 13  R Median - Orthodromic (Dig II, Mid palm)     Dig II Wrist 3.5 30 Dig II - Wrist 13  L Ulnar - Orthodromic, (Dig V, Mid palm)     Dig V Wrist 2.9 11 Dig V - Wrist 11  R Ulnar - Orthodromic, (Dig V, Mid palm)     Dig V Wrist 2.4 24 Dig V - Wrist 36             F  Wave    Nerve F Lat Ref.   ms ms  L Median - APB 25.3 ?31.0  L Ulnar - ADM 26.5 ?32.0  R Median - APB 26.1 ?31.0  R Ulnar - ADM 27.1 ?32.0             EMG

## 2017-01-18 ENCOUNTER — Ambulatory Visit
Admission: RE | Admit: 2017-01-18 | Discharge: 2017-01-18 | Disposition: A | Discharge status: Home / Self Care | Payer: Medicare Other | Source: Ambulatory Visit | Attending: Neurology | Admitting: Neurology

## 2017-01-18 ENCOUNTER — Telehealth: Payer: Self-pay | Admitting: Neurology

## 2017-01-18 DIAGNOSIS — M79601 Pain in right arm: Secondary | ICD-10-CM

## 2017-01-18 DIAGNOSIS — M47812 Spondylosis without myelopathy or radiculopathy, cervical region: Secondary | ICD-10-CM

## 2017-01-18 DIAGNOSIS — M79602 Pain in left arm: Principal | ICD-10-CM

## 2017-01-18 MED ORDER — NORTRIPTYLINE HCL 10 MG PO CAPS: ORAL_CAPSULE | ORAL | 3 refills | Status: DC

## 2017-01-18 NOTE — Telephone Encounter (Signed)
I called the patient. The MRI of the neck does show some straightening of the spine consistent with spasm, there are some mild arthritic changes in the neck, no definite evidence of spinal cord or nerve root impingement.  The paresthesias in the arms may be related to chronic spasm in the neck. The patient will be placed on nortriptyline and setup for physical therapy.   MRI cervical 01/18/17:  IMPRESSION:  Abnormal MRI scan cervical spine showing mild spondylitic changes at C5-6 and C6-7 resulting in mild canal and moderate bilateral foraminal narrowing.

## 2017-01-20 ENCOUNTER — Encounter (HOSPITAL_COMMUNITY): Payer: Self-pay

## 2017-01-20 ENCOUNTER — Emergency Department (HOSPITAL_COMMUNITY)
Admission: EM | Admit: 2017-01-20 | Discharge: 2017-01-20 | Disposition: A | Discharge status: Home / Self Care | Payer: Medicare Other | Attending: Emergency Medicine | Admitting: Emergency Medicine

## 2017-01-20 DIAGNOSIS — Z5321 Procedure and treatment not carried out due to patient leaving prior to being seen by health care provider: Secondary | ICD-10-CM | POA: Insufficient documentation

## 2017-01-20 DIAGNOSIS — R202 Paresthesia of skin: Secondary | ICD-10-CM | POA: Insufficient documentation

## 2017-01-20 NOTE — ED Triage Notes (Signed)
Patient complains of generalized body aches x 2 weeks, thinks related to her arthritis, NAD. Alert and oriented

## 2017-01-23 ENCOUNTER — Encounter: Payer: 59 | Admitting: Neurology

## 2017-01-31 ENCOUNTER — Ambulatory Visit: Payer: 59 | Attending: Neurology | Admitting: Rehabilitative and Restorative Service Providers"

## 2017-01-31 DIAGNOSIS — R293 Abnormal posture: Secondary | ICD-10-CM | POA: Insufficient documentation

## 2017-01-31 DIAGNOSIS — M542 Cervicalgia: Secondary | ICD-10-CM | POA: Diagnosis present

## 2017-01-31 DIAGNOSIS — M6281 Muscle weakness (generalized): Secondary | ICD-10-CM | POA: Diagnosis present

## 2017-01-31 NOTE — Patient Instructions (Signed)
Healthy Back - Shoulder Roll    Stand straight with arms relaxed at sides. Roll shoulders backward continuously. Do __10__ times.  Can repeat throughout the day, as tolerated.  Copyright  VHI. All rights reserved.   Chest Flexibility: Shoulder Opener (Doorframe)    Roll shoulders back and down, pressing forward against doorframe. Hold for __20 seconds. Repeat ___3_ times.  Copyright  VHI. All rights reserved.   Neurovascular: Median Nerve Glide With Cervical Bias    Stand with right arm out to side, palm flat against wall, thumb up, elbow straight. Slowly move opposite side ear toward shoulder as far as possible without pain. Hold 10 seconds. Repeat __3__ times per set.   Copyright  VHI. All rights reserved.
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
## 2017-02-07 ENCOUNTER — Ambulatory Visit: Payer: 59

## 2017-02-07 DIAGNOSIS — M542 Cervicalgia: Secondary | ICD-10-CM | POA: Diagnosis not present

## 2017-02-07 DIAGNOSIS — M6281 Muscle weakness (generalized): Secondary | ICD-10-CM

## 2017-02-07 DIAGNOSIS — R293 Abnormal posture: Secondary | ICD-10-CM

## 2017-02-07 NOTE — Patient Instructions (Signed)
Healthy Back - Shoulder Roll    Stand straight with arms relaxed at sides. Roll shoulders backward continuously. Do __10__ times.  Can repeat throughout the day, as tolerated.  Copyright  VHI. All rights reserved.   Chest Flexibility: Shoulder Opener (Doorframe)    Roll shoulders back and down, pressing forward against doorframe. Hold for __20-30  seconds. Repeat ___3_ times.  Copyright  VHI. All rights reserved.   Neurovascular: Median Nerve Glide With Cervical Bias    Stand with right arm out to side, palm flat against wall, thumb up, elbow straight. Slowly move opposite side ear toward shoulder as far as possible without pain. Hold 10 seconds. Repeat __3__ times per set.   Copyright  VHI. All rights reserved.     Cervical retraction: Lie down with head on pillow. Tuck chin and push head into pillow, hold for 3 seconds. Repeat 10 times. Three times a week.   Deep neck flexor exercise: Lie with head on pillow. Tuck chin and hold for 3 seconds. Repeat 5 times. Three times a week.
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
## 2017-02-14 ENCOUNTER — Ambulatory Visit: Payer: Medicare Other

## 2017-02-15 ENCOUNTER — Ambulatory Visit: Payer: 59 | Admitting: Rehabilitative and Restorative Service Providers"

## 2017-02-18 ENCOUNTER — Telehealth: Payer: Self-pay | Admitting: Rehabilitative and Restorative Service Providers"

## 2017-02-18 ENCOUNTER — Ambulatory Visit: Payer: 59 | Admitting: Rehabilitative and Restorative Service Providers"

## 2017-02-18 NOTE — Telephone Encounter (Signed)
The patient missed visit on Friday 10/26 and today 02/18/17.  PT called patient and she called EMS last night due to pain in her thighs (both sides).    She wants to hold PT visits until after seeing her doctor for new onset of bilateral leg pain.  Fabio Wah, PT

## 2017-02-21 ENCOUNTER — Ambulatory Visit: Payer: Medicare Other | Admitting: Rehabilitative and Restorative Service Providers"

## 2017-02-25 ENCOUNTER — Ambulatory Visit: Payer: Medicare Other | Admitting: Rehabilitative and Restorative Service Providers"

## 2017-02-27 ENCOUNTER — Ambulatory Visit: Payer: Medicare Other | Admitting: Rehabilitative and Restorative Service Providers"

## 2017-03-04 ENCOUNTER — Ambulatory Visit: Payer: Medicare Other | Admitting: Rehabilitative and Restorative Service Providers"

## 2017-03-06 ENCOUNTER — Ambulatory Visit: Payer: Medicare Other

## 2017-03-06 ENCOUNTER — Other Ambulatory Visit: Payer: Self-pay | Admitting: Orthopedic Surgery

## 2017-03-06 DIAGNOSIS — M79651 Pain in right thigh: Secondary | ICD-10-CM

## 2017-03-06 DIAGNOSIS — M79652 Pain in left thigh: Principal | ICD-10-CM

## 2017-03-08 ENCOUNTER — Ambulatory Visit
Admission: RE | Admit: 2017-03-08 | Discharge: 2017-03-08 | Disposition: A | Discharge status: Home / Self Care | Payer: Medicare Other | Source: Ambulatory Visit | Attending: Orthopedic Surgery | Admitting: Orthopedic Surgery

## 2017-03-08 DIAGNOSIS — M79651 Pain in right thigh: Secondary | ICD-10-CM

## 2017-03-08 DIAGNOSIS — M79652 Pain in left thigh: Principal | ICD-10-CM

## 2017-03-22 ENCOUNTER — Ambulatory Visit (INDEPENDENT_AMBULATORY_CARE_PROVIDER_SITE_OTHER): Payer: 59 | Admitting: Neurology

## 2017-03-22 ENCOUNTER — Other Ambulatory Visit: Payer: Self-pay

## 2017-03-22 ENCOUNTER — Encounter: Payer: Self-pay | Admitting: Neurology

## 2017-03-22 VITALS — BP 136/78 | HR 90 | Ht 63.0 in | Wt 201.0 lb

## 2017-03-22 DIAGNOSIS — M797 Fibromyalgia: Secondary | ICD-10-CM | POA: Diagnosis not present

## 2017-03-22 MED ORDER — DULOXETINE HCL 30 MG PO CPEP: 30.0000 mg | ORAL_CAPSULE | Freq: Two times a day (BID) | ORAL | 3 refills | Status: DC

## 2017-03-22 NOTE — Patient Instructions (Signed)
   We will start Cymbalta for the fibromyalgia.  Start 30 mg (one tablet) daily for 2 weeks, then take one twice a day.  Cymbalta (duloxetine) is an antidepressant medication that is commonly used for peripheral neuropathy pain or for fibromyalgia pain. As with any antidepressant medication, worsening depression can be seen. This medication can potentially cause headache, dizziness, sexual dysfunction, or nausea. If any problems are noted on this medication, please contact our office.   We will get back into physical therapy.

## 2017-03-22 NOTE — Progress Notes (Signed)
Reason for visit: Fibromyalgia  Kristine Scott is an 61 y.o. female  History of present illness:  Kristine Scott is a 61 year old right-handed Hispanic female with a history of fibromyalgia.  The patient has diffuse neuromuscular discomfort affecting the neck, shoulders, back, and thighs down to the knees.  The patient has been in physical therapy but her leg pain became severe and physical therapy was stopped.  The patient has undergone MRI of the cervical spine and lumbar spine that did not show any evidence of significant spondylosis or spinal cord or nerve root impingement at any level.  The patient does have some straightening of the spine which is felt to be associated with muscle tension and spasm and may be associated with the paresthesias that she reported in the hands.  The patient underwent nerve conduction studies that did not show evidence of a neuropathy, she refused EMG evaluation.  The patient was placed on nortriptyline, but she stopped the medication as it made her dizzy.  The patient comes into the office today for an evaluation.  Past Medical History:  Diagnosis Date  . Fibromyalgia 12/07/2016  . Hypertension     Past Surgical History:  Procedure Laterality Date  . kidney      Stone removal    Family History  Problem Relation Age of Onset  . Heart disease Father     Social history:  reports that she quit smoking about 10 months ago. She smoked 0.25 packs per day. she has never used smokeless tobacco. She reports that she does not drink alcohol or use drugs.    Allergies  Allergen Reactions  . Dust Mite Extract   . Other     Cats, dogs    Medications:  Prior to Admission medications   Medication Sig Start Date End Date Taking? Authorizing Provider  amlodipine-benazepril (LOTREL) 2.5-10 MG capsule Take 1 capsule by mouth daily.   Yes [provider]  HYDROcodone-ibuprofen (VICOPROFEN) 7.5-200 MG tablet Take 1 tablet by mouth every 6 (six) hours  as needed for moderate pain.   Yes [provider]  lisinopril-hydrochlorothiazide (PRINZIDE,ZESTORETIC) 20-25 MG tablet Take 1 tablet by mouth daily.   Yes [provider]  nortriptyline (PAMELOR) 10 MG capsule Take one capsule at night for one week, then take 2 capsules at night for one week, then take 3 capsules at night 01/18/17  Yes [place] SpanielWillis, Charles K, MD    ROS:  Out of a complete 14 system review of symptoms, the patient complains only of the following symptoms, and all other reviewed systems are negative.  Joint pain, joint swelling, back pain, aching muscles, neck pain Numbness Depression, anxiety  Blood pressure 136/78, pulse 90, height 5\' 3"  (1.6 m), weight 201 lb (91.2 kg).  Physical Exam  General: The patient is alert and cooperative at the time of the examination.  The patient is moderately obese.  Neuromuscular: The patient has good range of movement the cervical spine.  The patient is able to flex only to about 50 degrees with a low back.  Skin: No significant peripheral edema is noted.   Neurologic Exam  Mental status: The patient is alert and oriented x 3 at the time of the examination. The patient has apparent normal recent and remote memory, with an apparently normal attention span and concentration ability.   Cranial nerves: Facial symmetry is present. Speech is normal, no aphasia or dysarthria is noted. Extraocular movements are full. Visual fields are full.  Motor: The patient  has good strength in all 4 extremities.  Sensory examination: Soft touch sensation is symmetric on the face, arms, and legs.  Coordination: The patient has good finger-nose-finger and heel-to-shin bilaterally.  Gait and station: The patient has a normal gait. Tandem gait is normal. Romberg is negative. No drift is seen.  Reflexes: Deep tendon reflexes are symmetric.   MRI cervical 01/18/17:  IMPRESSION: Abnormal MRI scan cervical spine showing mild spondylitic  changes at C5-6 and C6-7 resulting in mild canal and moderate bilateral foraminal narrowing.  * MRI scan images were reviewed online. I agree with the written report.   MRI lumbar 03/08/17:  IMPRESSION: Mild lumbar degenerative disc disease and multilevel mild to moderate facet arthrosis without spinal canal or neural foraminal Stenosis.  Assessment/Plan:  1.  Fibromyalgia  The patient will be placed on Cymbalta, she will be sent back for physical therapy.  The purpose of physical therapy is to teach the patient have to initiate a low grade exercise program to help reduce neuromuscular pain.  The pain will be chronic in nature, the patient is instructed to remain active, a sedative lifestyle will increase the pain.  The patient will call for dose adjustments of the Cymbalta.  She will follow-up in 4 months.  Marlan Palau. Keith Willis MD 03/22/2017 8:01 AM  [facility] [address] [place], [place] [address]  Phone [phone] Fax [phone]

## 2017-04-04 ENCOUNTER — Ambulatory Visit: Payer: 59 | Attending: Neurology

## 2017-04-04 DIAGNOSIS — G8929 Other chronic pain: Secondary | ICD-10-CM | POA: Insufficient documentation

## 2017-04-04 DIAGNOSIS — M6281 Muscle weakness (generalized): Secondary | ICD-10-CM | POA: Insufficient documentation

## 2017-04-04 DIAGNOSIS — M545 Low back pain: Secondary | ICD-10-CM | POA: Insufficient documentation

## 2017-04-04 DIAGNOSIS — R293 Abnormal posture: Secondary | ICD-10-CM | POA: Diagnosis present

## 2017-04-04 DIAGNOSIS — M542 Cervicalgia: Secondary | ICD-10-CM
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
## 2017-04-10 ENCOUNTER — Ambulatory Visit: Payer: 59

## 2017-04-10 DIAGNOSIS — R293 Abnormal posture: Secondary | ICD-10-CM

## 2017-04-10 DIAGNOSIS — M542 Cervicalgia: Secondary | ICD-10-CM

## 2017-04-10 DIAGNOSIS — M545 Low back pain: Secondary | ICD-10-CM | POA: Diagnosis not present

## 2017-04-10 DIAGNOSIS — M6281 Muscle weakness (generalized): Secondary | ICD-10-CM

## 2017-04-10 NOTE — Patient Instructions (Addendum)
   Walking Program:  Begin walking for exercise for 6 minutes, 1-2 times/day, 5 days/week.   Progress your walking program by adding 1 minutes to your routine each week, as tolerated. Be sure to wear good walking shoes, walk in a safe environment and only progress to your tolerance.       Healthy Back - Shoulder Roll    Stand straight with arms relaxed at sides. Roll shoulders backward continuously. Do __10__ times. Can repeat throughout the day, as tolerated.  Copyright  VHI. All rights reserved.  Chest Flexibility: Shoulder Opener (Doorframe)    Roll shoulders back and down, pressing forward against doorframe. Hold for __20-30  seconds. Repeat ___3_ times. Perform every day.   Copyright  VHI. All rights reserved.  Neurovascular: Median Nerve Glide With Cervical Bias    Stand with right arm out to side, palm flat against wall, thumb up, elbow straight. Slowly move opposite side ear toward shoulder as far as possible without pain. Hold 10 seconds. Repeat __3__ times per set. Perform every day.  Copyright  VHI. All rights reserved.    Cervical retraction: Lie down with head on pillow. Tuck chin and push head into pillow, hold for 3 seconds. Repeat 10 times. Three times a week.   Deep neck flexor exercise: Lie with head on pillow. Tuck chin and hold for 3 seconds. Repeat 5 times. Three times a week.
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
## 2017-04-12 ENCOUNTER — Ambulatory Visit: Payer: 59

## 2017-04-17 ENCOUNTER — Ambulatory Visit: Payer: 59

## 2017-04-18 ENCOUNTER — Ambulatory Visit: Payer: 59

## 2017-04-18 NOTE — Therapy (Signed)
[facility] [address] [place], [place], [address] Phone: [phone]   Fax:  [phone]  Patient Details  Name: Kristine Scott MRN: [id number] Date of Birth: [birth year] Referring Provider:  No ref. provider found  Encounter Date: 05/16/17   PHYSICAL THERAPY DISCHARGE SUMMARY  Visits from Start of Care: 2  Current functional level related to goals / functional outcomes: PT Long Term Goals - 04/10/17 0930      PT LONG TERM GOAL #1   Title  Reduce neck disability index to < or equal to 50%. TARGET DATE FOR ALL LTGS: 05/02/17    Baseline  70% NDI at evaluation (from previous episode of care but still current)    Time  8    Period  Weeks    Status  New      PT LONG TERM GOAL #2   Title  Patient will demonstrate improved B cervical rotation to 60 degrees.    Baseline  left rotation 28 degrees and right rotation 26 degrees    Time  8    Period  Weeks    Status  New      PT LONG TERM GOAL #3   Title  The patient will improve bilateral UE strength to 4+/5 for improved functional strength for ADLs.    Baseline  2+/5 B shoulder flexion and     Time  8    Period  Weeks    Status  New      PT LONG TERM GOAL #4   Title  The patient will return demo correct sitting posture and sleeping positions to manage chronic pain during daily activities.    Baseline  abnormal posture and sleeping positions may contribute to chronic pain-reports she wakes around 3:30am in the morning 2/2 pain.    Time  8    Period  Weeks    Status  New      PT LONG TERM GOAL #5   Title  Pt will report BLE at worse is 4/10 to improve ability to perform iADLs    Baseline  6/10 today.    Status  New      PT LONG TERM GOAL #6   Title  Pt will perform 6MWT and improve distance to 1469' to improve endurance.     Baseline  1269'    Status  Revised      PT LONG TERM GOAL #7   Title  Pt will be IND in HEP to improve strength, posture,  flexibility, endurance, and pain.     Baseline  Not performing HEP.    Status  New         Remaining deficits: Unknown, as pt cancelled remaining appt's as she's moving to [place].   Education / Equipment: PT frequency, duration, POC, and former HEP.  Plan: Patient agrees to discharge.  Patient goals were not met. Patient is being discharged due to not returning since the last visit.  ?????       G-Codes - 05-16-2017 1332    Functional Assessment Tool Used (Outpatient Only)  6/10 LE pain (same as eval, as pt did not return)    Functional Limitation  Self care    Self Care Goal Status ([id number])  At least 20 percent but less than 40 percent impaired, limited or restricted    Self Care Discharge Status [phone])  At least 40 percent but less than 60 percent impaired, limited or restricted  Haruki Arnold L 04/18/2017, 1:35 PM  [facility] [address] [place], [place], [address] Phone: [phone]   Fax:  [phone]    Geoffry Paradise, PT,DPT 04/18/17 1:36 PM Phone: [phone] Fax: [phone]

## 2017-04-22 ENCOUNTER — Ambulatory Visit: Payer: Medicare Other | Admitting: Rehabilitative and Restorative Service Providers"

## 2017-04-30 ENCOUNTER — Ambulatory Visit: Payer: Medicare Other

## 2017-05-03 ENCOUNTER — Ambulatory Visit: Payer: Medicare Other | Admitting: Rehabilitative and Restorative Service Providers"

## 2017-06-14 ENCOUNTER — Other Ambulatory Visit: Payer: Self-pay | Admitting: Neurology

## 2017-06-14 NOTE — Telephone Encounter (Signed)
Pt calling for a refill of DULoxetine (CYMBALTA) 30 MG capsule to go to  [facility] [id number] - [place], [place] - [address] [facility] [address] [address] [phone] (Phone) [phone] (Fax)

## 2017-06-14 NOTE — Telephone Encounter (Signed)
I called pt. She should have enough cymbalta to last her through 07/20/17 based on Dr. Anne HahnWillis' last RX for her written 03/22/17. I asked pt to call her pharmacy and confirm this and if this is not the case, to have her pharmacy reach out to us. She was given a 4 month supply of cymbalta in November. Pt verbalized understanding.

## 2017-06-17 ENCOUNTER — Telehealth: Payer: Self-pay | Admitting: Neurology

## 2017-06-17 MED ORDER — DULOXETINE HCL 60 MG PO CPEP: 60.0000 mg | ORAL_CAPSULE | Freq: Two times a day (BID) | ORAL | 1 refills | Status: AC

## 2017-06-17 NOTE — Telephone Encounter (Signed)
Called and LVM for pt to call back and discuss rx refill request.

## 2017-06-17 NOTE — Telephone Encounter (Signed)
Pt has called back stating that Dr Anne HahnWillis did not fill her DULoxetine (CYMBALTA) 30 MG capsule , the message from RN Baxter HireKristen was relayed to pt.  Pt states she took her last two on yesterday.  Pt is asking for a call back please

## 2017-06-17 NOTE — Telephone Encounter (Signed)
I called pharmacy. They filled rx duloxetine 30mg  on 03/22/17 qty 180 (3 month supply) and again on 05/12/17 qty 60 (1 month supply). It would still be too early for a refill. Will send to MD to review.

## 2017-06-17 NOTE — Telephone Encounter (Signed)
I called the patient.  The patient is taking 60 mg twice daily of the Cymbalta.  I will change the prescription to reflect this.

## 2017-06-17 NOTE — Addendum Note (Signed)
Addended by: Eilene GhaziKING, EMMA L on: 06/17/2017 11:04 AM   Modules accepted: Orders

## 2017-07-22 ENCOUNTER — Ambulatory Visit: Payer: 59 | Admitting: Adult Health

## 2020-04-02 ENCOUNTER — Other Ambulatory Visit: Payer: Self-pay

## 2020-04-02 ENCOUNTER — Encounter (HOSPITAL_COMMUNITY): Payer: Self-pay | Admitting: *Deleted

## 2020-04-02 ENCOUNTER — Emergency Department (HOSPITAL_COMMUNITY)
Admission: EM | Admit: 2020-04-02 | Discharge: 2020-04-02 | Disposition: A | Discharge status: Home / Self Care | Payer: 59 | Attending: Emergency Medicine | Admitting: Emergency Medicine

## 2020-04-02 ENCOUNTER — Emergency Department (HOSPITAL_COMMUNITY): Payer: 59

## 2020-04-02 DIAGNOSIS — Z87891 Personal history of nicotine dependence: Secondary | ICD-10-CM | POA: Insufficient documentation

## 2020-04-02 DIAGNOSIS — R0981 Nasal congestion: Secondary | ICD-10-CM | POA: Diagnosis not present

## 2020-04-02 DIAGNOSIS — Z79899 Other long term (current) drug therapy: Secondary | ICD-10-CM | POA: Insufficient documentation

## 2020-04-02 DIAGNOSIS — I10 Essential (primary) hypertension: Secondary | ICD-10-CM | POA: Insufficient documentation

## 2020-04-02 DIAGNOSIS — R059 Cough, unspecified: Secondary | ICD-10-CM | POA: Diagnosis present

## 2020-04-02 DIAGNOSIS — Z20822 Contact with and (suspected) exposure to covid-19: Secondary | ICD-10-CM | POA: Diagnosis not present

## 2020-04-02 HISTORY — DX: Dorsalgia, unspecified: M54.9

## 2020-04-02 MED ORDER — ALBUTEROL SULFATE (2.5 MG/3ML) 0.083% IN NEBU: 5.0000 mg | INHALATION_SOLUTION | Freq: Once | RESPIRATORY_TRACT | Status: DC

## 2020-04-02 MED ORDER — BENZONATATE 100 MG PO CAPS: 100.0000 mg | ORAL_CAPSULE | Freq: Three times a day (TID) | ORAL | 0 refills | Status: AC

## 2020-04-02 MED ORDER — AZITHROMYCIN 250 MG PO TABS: 250.0000 mg | ORAL_TABLET | Freq: Every day | ORAL | 0 refills | Status: AC

## 2020-04-02 NOTE — ED Provider Notes (Signed)
[facility]-EMERGENCY DEPT Provider Note   CSN: [id number] Arrival date & time: 04/02/20  1803     History Chief Complaint  Patient presents with  . Cough    Kristine Scott is a 64 y.o. female.  Patient presents to the emergency department with a chief complaint of cough.  She is here with her mother and her son, who are sick with the same symptoms.  Symptom onset was 3 days ago.  She is vaccinated against Covid.  She denies any fevers or chills.  She states that she has cough and sinus congestion.  She denies any successful treatments prior to arrival.  Denies any other associated symptoms.  The history is provided by the patient. No language interpreter was used.       Past Medical History:  Diagnosis Date  . Arthritis   . Back pain   . Fibromyalgia 12/07/2016  . Hypertension     Patient Active Problem List   Diagnosis Date Noted  . Paresthesia and pain of both upper extremities 12/07/2016  . Fibromyalgia 12/07/2016  . HTN (hypertension) 06/06/2016    Past Surgical History:  Procedure Laterality Date  . kidney      Stone removal     OB History   No obstetric history on file.     Family History  Problem Relation Age of Onset  . Heart disease Father     Social History   Tobacco Use  . Smoking status: Former Smoker    Packs/day: 0.25    Quit date: 04/2016    Years since quitting: 3.9  . Smokeless tobacco: Never Used  Substance Use Topics  . Alcohol use: No  . Drug use: No    Home Medications Prior to Admission medications   Medication Sig Start Date End Date Taking? Authorizing Provider  amlodipine-benazepril (LOTREL) 2.5-10 MG capsule Take 1 capsule by mouth daily.    [provider]  azithromycin (ZITHROMAX) 250 MG tablet Take 1 tablet (250 mg total) by mouth daily. Take first 2 tablets together, then 1 every day until finished. 04/02/20   Roxy Horseman, PA-C  benzonatate (TESSALON) 100 MG capsule Take 1 capsule  (100 mg total) by mouth every 8 (eight) hours. 04/02/20   Roxy Horseman, PA-C  DULoxetine (CYMBALTA) 60 MG capsule Take 1 capsule (60 mg total) by mouth 2 (two) times daily. 06/17/17   York Spaniel, MD  HYDROcodone-ibuprofen (VICOPROFEN) 7.5-200 MG tablet Take 1 tablet by mouth every 6 (six) hours as needed for moderate pain.    [provider]  lisinopril-hydrochlorothiazide (PRINZIDE,ZESTORETIC) 20-25 MG tablet Take 1 tablet by mouth daily.    [provider]    Allergies    Dust mite extract and Other  Review of Systems   Review of Systems  All other systems reviewed and are negative.   Physical Exam Updated Vital Signs BP (!) 121/107 (BP Location: Right Arm)   Pulse 97   Temp 97.8 F (36.6 C) (Oral)   Resp 20   Ht 5\' 3"  (1.6 m)   Wt 99.8 kg   SpO2 100%   BMI 38.97 kg/m   Physical Exam Vitals and nursing note reviewed.  Constitutional:      General: She is not in acute distress.    Appearance: She is well-developed and well-nourished.  HENT:     Head: Normocephalic and atraumatic.  Eyes:     Conjunctiva/sclera: Conjunctivae normal.  Cardiovascular:     Rate and Rhythm: Normal rate  and regular rhythm.     Heart sounds: No murmur heard.   Pulmonary:     Effort: Pulmonary effort is normal. No respiratory distress.     Breath sounds: Normal breath sounds.  Abdominal:     Palpations: Abdomen is soft.     Tenderness: There is no abdominal tenderness.  Musculoskeletal:        General: No edema. Normal range of motion.     Cervical back: Neck supple.  Skin:    General: Skin is warm and dry.  Neurological:     Mental Status: She is alert and oriented to person, place, and time.  Psychiatric:        Mood and Affect: Mood and affect and mood normal.        Behavior: Behavior normal.     ED Results / Procedures / Treatments   Labs (all labs ordered are listed, but only abnormal results are displayed) Labs Reviewed  RESP PANEL BY RT-PCR  (FLU A&B, COVID) [id number]    EKG None  Radiology DG Chest 2 View  Result Date: 04/02/2020 CLINICAL DATA:  Cough EXAM: CHEST - 2 VIEW COMPARISON:  None. FINDINGS: The heart size and mediastinal contours are within normal limits. Both lungs are clear. Disc degenerative disease of the thoracic spine. IMPRESSION: No acute abnormality of the lungs. Electronically Signed   By: Lauralyn Primes M.D.   On: 04/02/2020 19:07    Procedures Procedures (including critical care time)  Medications Ordered in ED Medications  albuterol (PROVENTIL) (2.5 MG/3ML) 0.083% nebulizer solution 5 mg (5 mg Nebulization Not Given 04/02/20 1847)    ED Course  I have reviewed the triage vital signs and the nursing notes.  Pertinent labs & imaging results that were available during my care of the patient were reviewed by me and considered in my medical decision making (see chart for details).    MDM Rules/Calculators/A&P                          Pt CXR negative for acute infiltrate. Patients symptoms are consistent with URI, likely viral etiology. Discussed that antibiotics are not indicated for viral infections, but will trial z-pak per request. Pt will be discharged with symptomatic treatment.  Verbalizes understanding and is agreeable with plan. Pt is hemodynamically stable & in NAD prior to dc.  Kristine Scott was evaluated in Emergency Department on 04/02/2020 for the symptoms described in the history of present illness. She was evaluated in the context of the global COVID-19 pandemic, which necessitated consideration that the patient might be at risk for infection with the SARS-CoV-2 virus that causes COVID-19. Institutional protocols and algorithms that pertain to the evaluation of patients at risk for COVID-19 are in a state of rapid change based on information released by regulatory bodies including the CDC and federal and state organizations. These policies and algorithms were followed during the patient's  care in the ED.  Final Clinical Impression(s) / ED Diagnoses Final diagnoses:  Cough    Rx / DC Orders ED Discharge Orders         Ordered    benzonatate (TESSALON) 100 MG capsule  Every 8 hours        04/02/20 2223    azithromycin (ZITHROMAX) 250 MG tablet  Daily        04/02/20 2223           Roxy Horseman, PA-C 04/02/20 2233    Wynetta Fines, MD  04/05/20 1533  

## 2020-04-02 NOTE — ED Triage Notes (Signed)
3 days of cough productive yellowish secretions, chest pain when cough, also has rt side head pain.

## 2020-04-03 LAB — RESP PANEL BY RT-PCR (FLU A&B, COVID) ARPGX2
Influenza A by PCR: NEGATIVE
Influenza B by PCR: NEGATIVE
SARS Coronavirus 2 by RT PCR: NEGATIVE

## 2022-06-06 IMAGING — CR DG CHEST 2V
2 series · 2 of 2 positions shown · non-contrast
Comparison: None.

CLINICAL DATA: Cough

EXAM:
CHEST - 2 VIEW

[w chest pa]
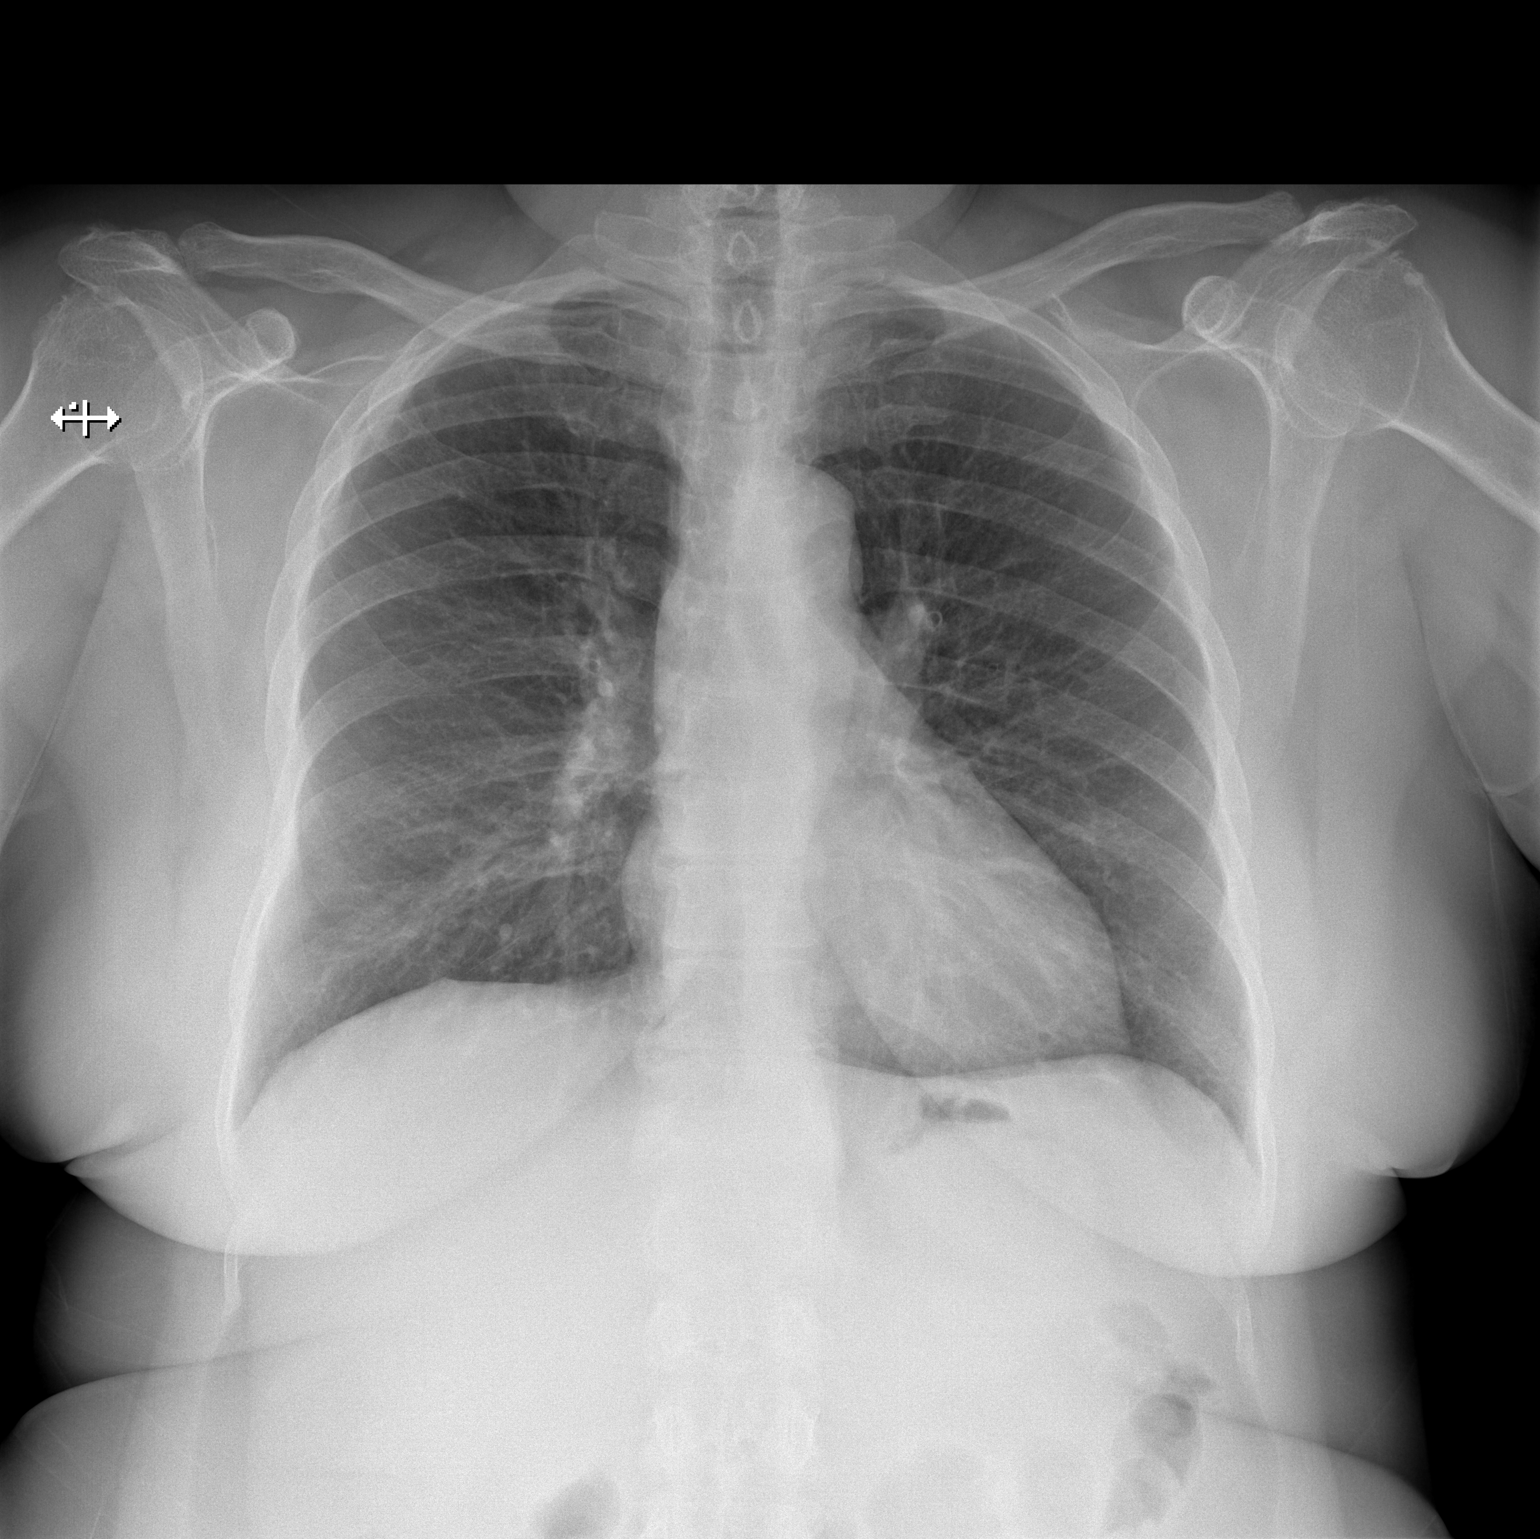

[w chest lat]
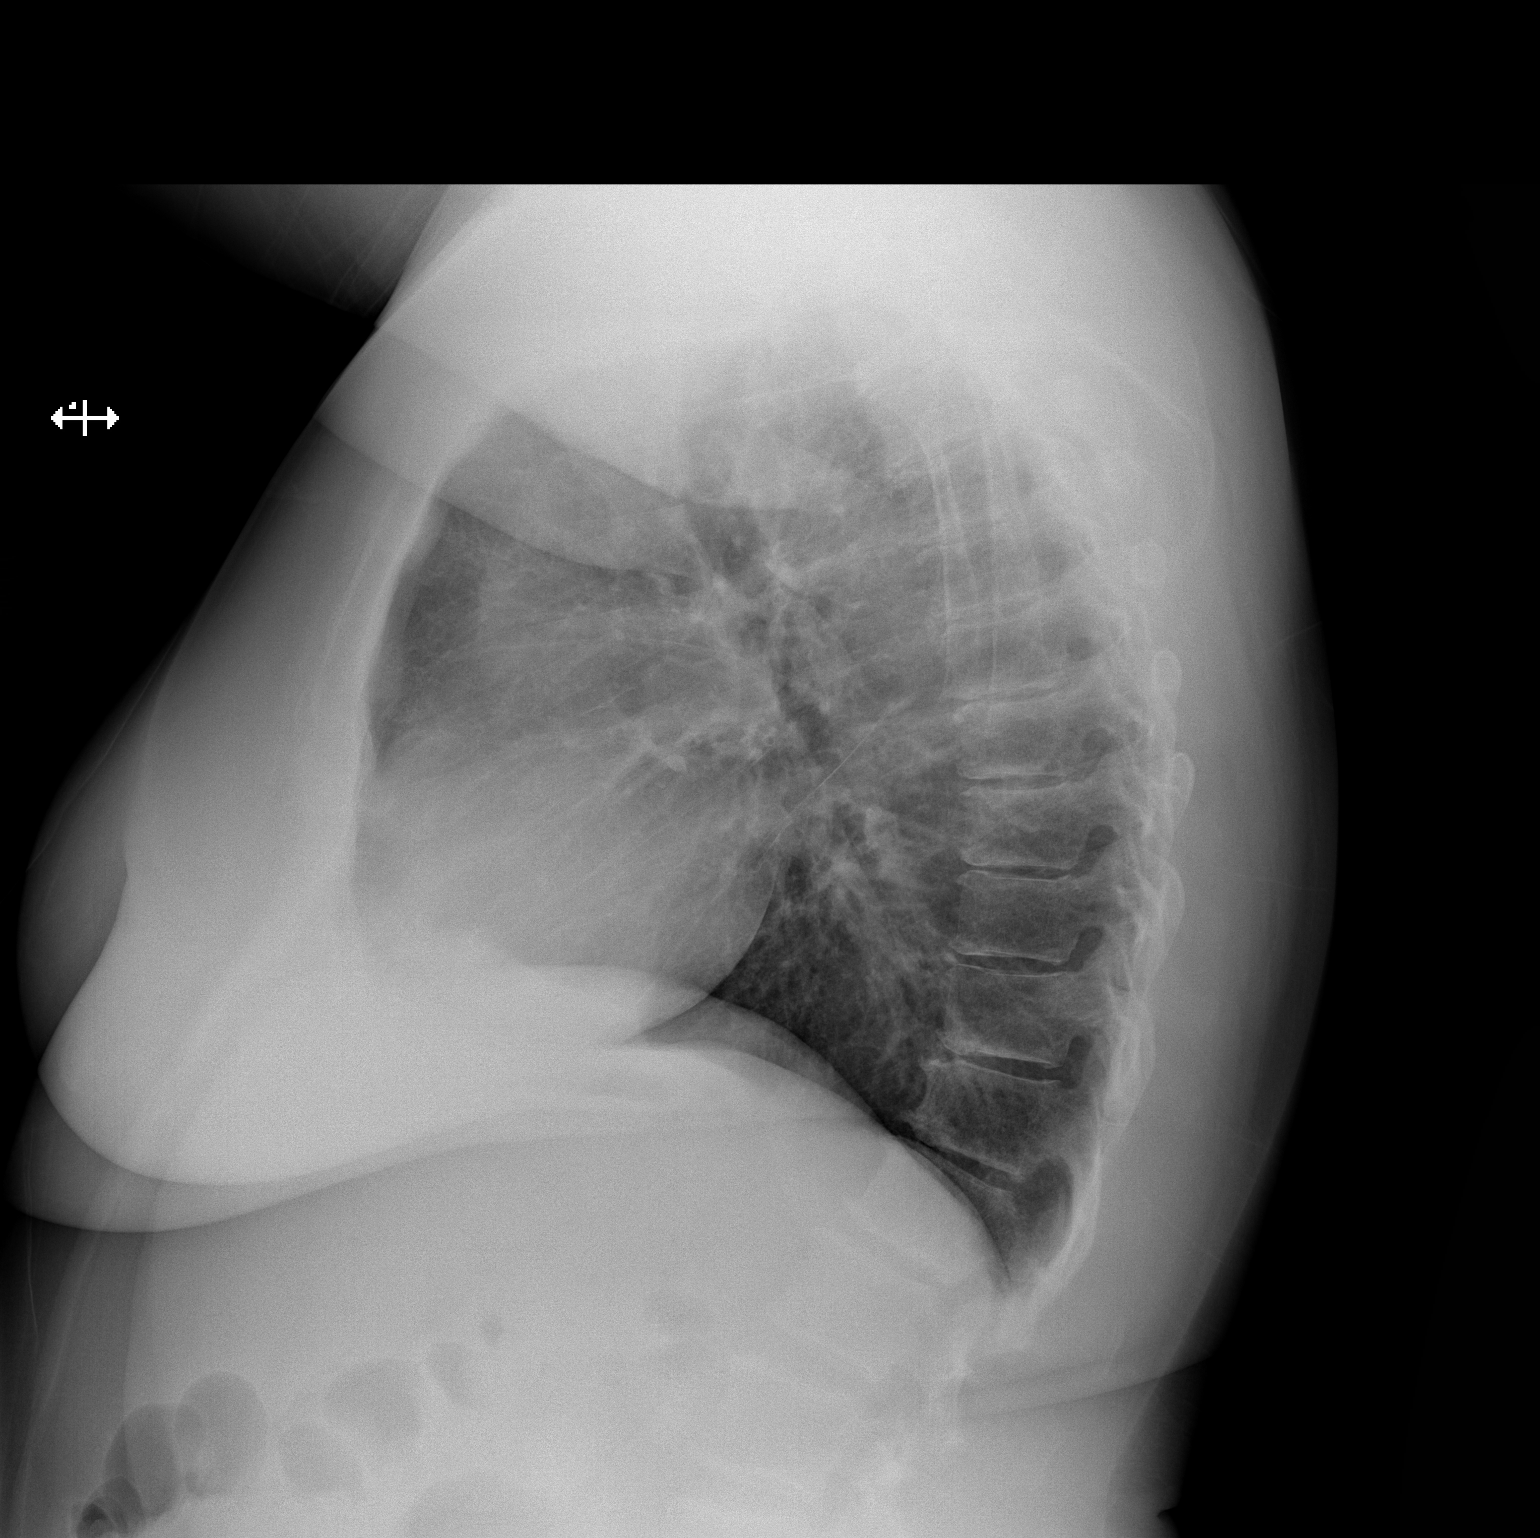

[2 of 2 positions shown; findings below may reference images not displayed]

FINDINGS: The heart size and mediastinal contours are within normal limits.
Both lungs are clear. Disc degenerative disease of the thoracic
spine.
IMPRESSION: No acute abnormality of the lungs.
